# Patient Record
Sex: Male | Born: 1965 | Race: Black or African American | Hispanic: No | Marital: Married | Smoking: Never smoker
Health system: Southern US, Community
[De-identification: ages and names within clinical notes are randomized; demographics above are authoritative.]

---

## 2006-07-17 ENCOUNTER — Emergency Department (HOSPITAL_COMMUNITY): Admission: EM | Admit: 2006-07-17 | Discharge: 2006-07-17 | Payer: Self-pay | Admitting: Emergency Medicine
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
## 2015-12-01 ENCOUNTER — Encounter (HOSPITAL_COMMUNITY): Payer: Self-pay | Admitting: Adult Health

## 2015-12-01 ENCOUNTER — Emergency Department (HOSPITAL_COMMUNITY)
Admission: EM | Admit: 2015-12-01 | Discharge: 2015-12-01 | Disposition: A | Discharge status: Home / Self Care | Payer: Self-pay | Attending: Emergency Medicine | Admitting: Emergency Medicine

## 2015-12-01 DIAGNOSIS — Z23 Encounter for immunization: Secondary | ICD-10-CM | POA: Insufficient documentation

## 2015-12-01 DIAGNOSIS — Y929 Unspecified place or not applicable: Secondary | ICD-10-CM | POA: Insufficient documentation

## 2015-12-01 DIAGNOSIS — Y9389 Activity, other specified: Secondary | ICD-10-CM | POA: Insufficient documentation

## 2015-12-01 DIAGNOSIS — S61011A Laceration without foreign body of right thumb without damage to nail, initial encounter: Secondary | ICD-10-CM | POA: Insufficient documentation

## 2015-12-01 DIAGNOSIS — W268XXA Contact with other sharp object(s), not elsewhere classified, initial encounter: Secondary | ICD-10-CM | POA: Insufficient documentation

## 2015-12-01 DIAGNOSIS — Y999 Unspecified external cause status: Secondary | ICD-10-CM | POA: Insufficient documentation

## 2015-12-01 MED ORDER — LIDOCAINE HCL (PF) 2 % IJ SOLN
10.0000 mL | Freq: Once | INTRAMUSCULAR | Status: DC
  Filled 2015-12-01: qty 10

## 2015-12-01 MED ORDER — TETANUS-DIPHTH-ACELL PERTUSSIS 5-2.5-18.5 LF-MCG/0.5 IM SUSP
0.5000 mL | Freq: Once | INTRAMUSCULAR | Status: AC
  Administered 2015-12-01: 0.5 mL via INTRAMUSCULAR
  Filled 2015-12-01: qty 0.5

## 2015-12-01 MED ORDER — SULFAMETHOXAZOLE-TRIMETHOPRIM 800-160 MG PO TABS: 1.0000 | ORAL_TABLET | Freq: Two times a day (BID) | ORAL | 0 refills | Status: AC

## 2015-12-01 NOTE — Discharge Instructions (Signed)
Wound recheck in 2 days.  Suture removal in 8 days

## 2015-12-01 NOTE — ED Triage Notes (Signed)
Presents with laceration to right thumb from a window that broke yesterday evening. CMS intact.

## 2015-12-01 NOTE — ED Provider Notes (Signed)
[facility]-EMERGENCY DEPT Provider Note   CSN: [id number] Arrival date & time: 12/01/15  1143  By signing my name below, I, Marcus Dudley, attest that this documentation has been prepared under the direction and in the presence of Langston MaskerKaren Jebediah Macrae, [place]PA-C. Electronically Signed: Angelene GiovanniEmmanuella Dudley, ED Scribe. 12/01/15. 1:30 PM.   History   Chief Complaint Chief Complaint  Patient presents with  . Laceration    HPI Comments: Marcus Dudley is a 50 y.o. male who presents to the Emergency Department complaining of an actively bleeding 3 cm laceration to the dorsum of right thumb and a smaller laceration to the right 5th finger s/p injury that occurred 10:30 pm last night. He explains that he was working on a broken window when it lacerated his hand. He denies any other injuries sustained. No alleviating factors noted. Pt has not tried any medications PTA. He has NKDA. He is unsure of his last tetanus vaccine. He denies any fever, chills, vomiting, or any other symptoms.   The history is provided by the patient. No language interpreter was used.    History reviewed. No pertinent past medical history.  There are no active problems to display for this patient.   History reviewed. No pertinent surgical history.     Home Medications    Prior to Admission medications   Not on File    Family History History reviewed. No pertinent family history.  Social History Social History  Substance Use Topics  . Smoking status: Never Smoker  . Smokeless tobacco: Never Used  . Alcohol use Yes     Comment: Drinks every day     Allergies   Patient has no known allergies.   Review of Systems Review of Systems  Constitutional: Negative for chills and fever.  Gastrointestinal: Negative for vomiting.  Skin: Positive for wound.  All other systems reviewed and are negative.    Physical Exam Updated Vital Signs BP 180/100 (BP Location: Right Arm)   Pulse 113   Temp 99 F (37.2 C) (Oral)    Resp 18   SpO2 100%   Physical Exam  Constitutional: He is oriented to person, place, and time. He appears well-developed and well-nourished. No distress.  HENT:  Head: Normocephalic and atraumatic.  Eyes: Conjunctivae and EOM are normal.  Neck: Neck supple. No tracheal deviation present.  Cardiovascular: Normal rate.   Pulmonary/Chest: Effort normal. No respiratory distress.  Musculoskeletal: Normal range of motion.  3 cm laceration to dorsum of left thumb, gapping Full ROM; neurovascularly and neurosensory intact   Neurological: He is alert and oriented to person, place, and time.  Skin: Skin is warm and dry.  Psychiatric: He has a normal mood and affect. His behavior is normal.  Nursing note and vitals reviewed.    ED Treatments / Results  DIAGNOSTIC STUDIES: Oxygen Saturation is 100% on RA, normal by my interpretation.    COORDINATION OF CARE: 12:46 PM- Pt advised of plan for treatment and pt agrees. Pt will receive Tdap here and laceration repair.    Labs (all labs ordered are listed, but only abnormal results are displayed) Labs Reviewed - No data to display  EKG  EKG Interpretation None       Radiology No results found.  Procedures .Marland Kitchen.Laceration Repair Date/Time: 12/01/2015 1:27 PM Performed by: Elson AreasSOFIA, Lety Cullens K Authorized by: Elson AreasSOFIA, Suhaib Guzzo K   Consent:    Consent obtained:  Verbal   Consent given by:  Patient   Risks discussed:  Pain Anesthesia (see MAR for  exact dosages):    Anesthesia method:  Local infiltration   Local anesthetic:  Lidocaine 2% w/o epi (3 cc) Laceration details:    Location:  Finger   Finger location:  R thumb   Length (cm):  3 Repair type:    Repair type:  Simple Pre-procedure details:    Preparation:  Patient was prepped and draped in usual sterile fashion Exploration:    Wound exploration: wound explored through full range of motion     Contaminated: no   Treatment:    Area cleansed with:  Betadine   Amount of  cleaning:  Standard   Irrigation solution:  Sterile saline   Irrigation method:  Pressure wash Skin repair:    Repair method:  Sutures   Suture size:  5-0   Suture material:  Prolene   Suture technique:  Simple interrupted   Number of sutures:  3 Approximation:    Approximation:  Close   Vermilion border: well-aligned   Post-procedure details:    Dressing:  Antibiotic ointment   Patient tolerance of procedure:  Tolerated well, no immediate complications    (including critical care time)  Medications Ordered in ED Medications  Tdap (BOOSTRIX) injection 0.5 mL (0.5 mLs Intramuscular Given 12/01/15 1206)     Initial Impression / Assessment and Plan / ED Course  Langston MaskerKaren Dafina Suk, PA-C has reviewed the triage vital signs and the nursing notes.  Pertinent labs & imaging results that were available during my care of the patient were reviewed by me and considered in my medical decision making (see chart for details).  Clinical Course      Tdap booster given. Pressure irrigation performed. Laceration occurred > 8 hours prior to repair which was well tolerated. Pt has no co morbidities to effect normal wound healing. Discussed suture home care w pt and answered questions. Pt to f-u for wound check and suture removal in 7 days. Pt is hemodynamically stable w no complaints prior to dc.     Final Clinical Impressions(s) / ED Diagnoses   Final diagnoses:  None    New Prescriptions Discharge Medication List as of 12/01/2015  1:31 PM    START taking these medications   Details  sulfamethoxazole-trimethoprim (BACTRIM DS,SEPTRA DS) 800-160 MG tablet Take 1 tablet by mouth 2 (two) times daily., Starting Mon 12/01/2015, Until Mon 12/08/2015, Print       An After Visit Summary was printed and given to the patient.  I personally performed the services in this documentation, which was scribed in my presence.  The recorded information has been reviewed and considered.   Barnet PallKaren SofiaPAC.     Lonia SkinnerLeslie K MistonSofia, PA-C 12/02/15 [id number]    Geoffery Lyonsouglas Delo, MD 12/02/15 [phone]

## 2019-10-09 ENCOUNTER — Other Ambulatory Visit: Payer: Self-pay

## 2019-10-09 ENCOUNTER — Encounter (HOSPITAL_COMMUNITY): Payer: Self-pay | Admitting: *Deleted

## 2019-10-09 ENCOUNTER — Emergency Department (HOSPITAL_COMMUNITY)
Admission: EM | Admit: 2019-10-09 | Discharge: 2019-10-09 | Disposition: A | Discharge status: Home / Self Care | Payer: Self-pay | Attending: Emergency Medicine | Admitting: Emergency Medicine

## 2019-10-09 ENCOUNTER — Emergency Department (HOSPITAL_COMMUNITY): Payer: Self-pay

## 2019-10-09 DIAGNOSIS — I10 Essential (primary) hypertension: Secondary | ICD-10-CM | POA: Insufficient documentation

## 2019-10-09 LAB — CBC WITH DIFFERENTIAL/PLATELET
Abs Immature Granulocytes: 0.01 10*3/uL (ref 0.00–0.07)
Basophils Absolute: 0 10*3/uL (ref 0.0–0.1)
Basophils Relative: 1 %
Eosinophils Absolute: 0 10*3/uL (ref 0.0–0.5)
Eosinophils Relative: 0 %
HCT: 41.4 % (ref 39.0–52.0)
Hemoglobin: 13.5 g/dL (ref 13.0–17.0)
Immature Granulocytes: 0 %
Lymphocytes Relative: 19 %
Lymphs Abs: 1 10*3/uL (ref 0.7–4.0)
MCH: 27.8 pg (ref 26.0–34.0)
MCHC: 32.6 g/dL (ref 30.0–36.0)
MCV: 85.2 fL (ref 80.0–100.0)
Monocytes Absolute: 0.7 10*3/uL (ref 0.1–1.0)
Monocytes Relative: 14 %
Neutro Abs: 3.2 10*3/uL (ref 1.7–7.7)
Neutrophils Relative %: 66 %
Platelets: 369 10*3/uL (ref 150–400)
RBC: 4.86 MIL/uL (ref 4.22–5.81)
RDW: 15.1 % (ref 11.5–15.5)
WBC: 4.9 10*3/uL (ref 4.0–10.5)
nRBC: 0 % (ref 0.0–0.2)

## 2019-10-09 LAB — HEPATIC FUNCTION PANEL
ALT: 25 U/L (ref 0–44)
AST: 34 U/L (ref 15–41)
Albumin: 4.1 g/dL (ref 3.5–5.0)
Alkaline Phosphatase: 74 U/L (ref 38–126)
Bilirubin, Direct: 0.2 mg/dL (ref 0.0–0.2)
Indirect Bilirubin: 1.3 mg/dL — ABNORMAL HIGH (ref 0.3–0.9)
Total Bilirubin: 1.5 mg/dL — ABNORMAL HIGH (ref 0.3–1.2)
Total Protein: 9.3 g/dL — ABNORMAL HIGH (ref 6.5–8.1)

## 2019-10-09 LAB — BASIC METABOLIC PANEL
Anion gap: 13 (ref 5–15)
BUN: 15 mg/dL (ref 6–20)
CO2: 25 mmol/L (ref 22–32)
Calcium: 9.5 mg/dL (ref 8.9–10.3)
Chloride: 97 mmol/L — ABNORMAL LOW (ref 98–111)
Creatinine, Ser: 0.79 mg/dL (ref 0.61–1.24)
GFR calc Af Amer: >60 mL/min (ref >60)
GFR calc non Af Amer: >60 mL/min (ref >60)
Glucose, Bld: 103 mg/dL — ABNORMAL HIGH (ref 70–99)
Potassium: 3.2 mmol/L — ABNORMAL LOW (ref 3.5–5.1)
Sodium: 135 mmol/L (ref 135–145)

## 2019-10-09 MED ORDER — LABETALOL HCL 100 MG PO TABS: 100.0000 mg | ORAL_TABLET | Freq: Two times a day (BID) | ORAL | 0 refills | Status: AC

## 2019-10-09 MED ORDER — LABETALOL HCL 5 MG/ML IV SOLN
20.0000 mg | Freq: Once | INTRAVENOUS | Status: AC
  Administered 2019-10-09: 20 mg via INTRAVENOUS
  Filled 2019-10-09: qty 4

## 2019-10-09 NOTE — ED Provider Notes (Signed)
[facility] EMERGENCY DEPARTMENT Provider Note   CSN: [id number] Arrival date & time: 10/09/19  1152     History Chief Complaint  Patient presents with  . Hypertension    Marcus Dudley is a 54 y.o. male.  Patient was applying for a job and had his blood pressure checked and it was elevated.  He has not seen a doctor in a long time.  Patient has no symptoms now  The history is provided by the patient and medical records. No language interpreter was used.  Hypertension This is a new problem. Episode onset: Unknown. The problem occurs constantly. The problem has not changed since onset.Pertinent negatives include no chest pain, no abdominal pain and no headaches. Nothing aggravates the symptoms. Nothing relieves the symptoms. He has tried nothing for the symptoms. The treatment provided no relief.       History reviewed. No pertinent past medical history.  There are no problems to display for this patient.   History reviewed. No pertinent surgical history.     History reviewed. No pertinent family history.  Social History   Tobacco Use  . Smoking status: Never Smoker  . Smokeless tobacco: Never Used  Substance Use Topics  . Alcohol use: Yes    Comment: Drinks every day  . Drug use: No    Home Medications Prior to Admission medications   Medication Sig Start Date End Date Taking? Authorizing Provider  labetalol (NORMODYNE) 100 MG tablet Take 1 tablet (100 mg total) by mouth 2 (two) times daily. 10/09/19   Bethann Berkshire, MD    Allergies    Patient has no known allergies.  Review of Systems   Review of Systems  Constitutional: Negative for appetite change and fatigue.  HENT: Negative for congestion, ear discharge and sinus pressure.   Eyes: Negative for discharge.  Respiratory: Negative for cough.   Cardiovascular: Negative for chest pain.  Gastrointestinal: Negative for abdominal pain and diarrhea.  Genitourinary: Negative for frequency and hematuria.    Musculoskeletal: Negative for back pain.  Skin: Negative for rash.  Neurological: Negative for seizures and headaches.  Psychiatric/Behavioral: Negative for hallucinations.    Physical Exam Updated Vital Signs BP (!) 224/102 (BP Location: Left Arm)   Pulse (!) 103   Temp 98.2 F (36.8 C) (Oral)   Resp 20   Ht 5\' 11"  (1.803 m)   Wt 74.8 kg   SpO2 99%   BMI 23.01 kg/m   Physical Exam Vitals and nursing note reviewed.  Constitutional:      Appearance: He is well-developed.  HENT:     Head: Normocephalic.     Right Ear: Tympanic membrane normal.  Eyes:     General: No scleral icterus.    Conjunctiva/sclera: Conjunctivae normal.  Neck:     Thyroid: No thyromegaly.  Cardiovascular:     Rate and Rhythm: Normal rate and regular rhythm.     Heart sounds: No murmur heard.  No friction rub. No gallop.   Pulmonary:     Breath sounds: No stridor. No wheezing or rales.  Chest:     Chest wall: No tenderness.  Abdominal:     General: There is no distension.     Tenderness: There is no abdominal tenderness. There is no rebound.  Musculoskeletal:        General: Normal range of motion.     Cervical back: Neck supple.  Lymphadenopathy:     Cervical: No cervical adenopathy.  Skin:    Findings: No erythema  or rash.  Neurological:     Mental Status: He is alert and oriented to person, place, and time.     Motor: No abnormal muscle tone.     Coordination: Coordination normal.  Psychiatric:        Behavior: Behavior normal.     ED Results / Procedures / Treatments   Labs (all labs ordered are listed, but only abnormal results are displayed) Labs Reviewed  BASIC METABOLIC PANEL - Abnormal; Notable for the following components:      Result Value   Potassium 3.2 (*)    Chloride 97 (*)    Glucose, Bld 103 (*)    All other components within normal limits  HEPATIC FUNCTION PANEL - Abnormal; Notable for the following components:   Total Protein 9.3 (*)    Total Bilirubin 1.5  (*)    Indirect Bilirubin 1.3 (*)    All other components within normal limits  CBC WITH DIFFERENTIAL/PLATELET    EKG None  Radiology DG Chest 2 View  Result Date: 10/09/2019 CLINICAL DATA:  Hypertension EXAM: CHEST - 2 VIEW COMPARISON:  None. FINDINGS: The heart size and mediastinal contours are within normal limits. No focal airspace consolidation, pleural effusion, or pneumothorax. 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 IMPRESSION: No active cardiopulmonary disease. Electronically Signed   By: Duanne Guess D.O.   On: 10/09/2019 14:51    Procedures Procedures (including critical care time)  Medications Ordered in ED Medications  labetalol (NORMODYNE) injection 20 mg (20 mg Intravenous Given 10/09/19 1408)    ED Course  I have reviewed the triage vital signs and the nursing notes.  Pertinent labs & imaging results that were available during my care of the patient were reviewed by me and considered in my medical decision making (see chart for details).    MDM Rules/Calculators/A&P                          Patient with significant hypertension.  He is started on labetalol follow-up with the family doctor     This patient presents to the ED for concern of hypertension, this involves an extensive number of treatment options, and is a complaint that carries with it a high risk of complications and morbidity.  The differential diagnosis includes essential hypertension   Lab Tests:   I Ordered, reviewed, and interpreted labs, which included CBC and chemistries which show potassium mildly low at 3.2  Medicines ordered:   I ordered medication labetalol for blood pressure  Imaging Studies ordered:   I ordered imaging studies which included chest x-ray  I independently visualized and interpreted imaging which showed unremarkable  Additional history  obtained:   Additional history obtained from record  Previous records obtained and reviewed.  Consultations Obtained:     Reevaluation:  After the interventions stated above, I reevaluated the patient and found unchanged  Critical Interventions:  .   Final Clinical Impression(s) / ED Diagnoses Final diagnoses:  Essential hypertension    Rx / DC Orders ED Discharge Orders         Ordered    labetalol (NORMODYNE) 100 MG tablet  2 times daily        10/09/19 1532           Bethann Berkshire, MD 10/11/19 1054

## 2019-10-09 NOTE — ED Triage Notes (Signed)
Pt states he is starting a new job and while they were doing his physical his BP was elevated and he needed to be evaluated

## 2019-10-09 NOTE — Discharge Instructions (Addendum)
Follow up next week for recheck °

## 2022-02-25 IMAGING — DX DG CHEST 2V
2 series · 2 of 2 positions shown · non-contrast
Comparison: None.

CLINICAL DATA: Hypertension

EXAM:
CHEST - 2 VIEW

[chest pa]
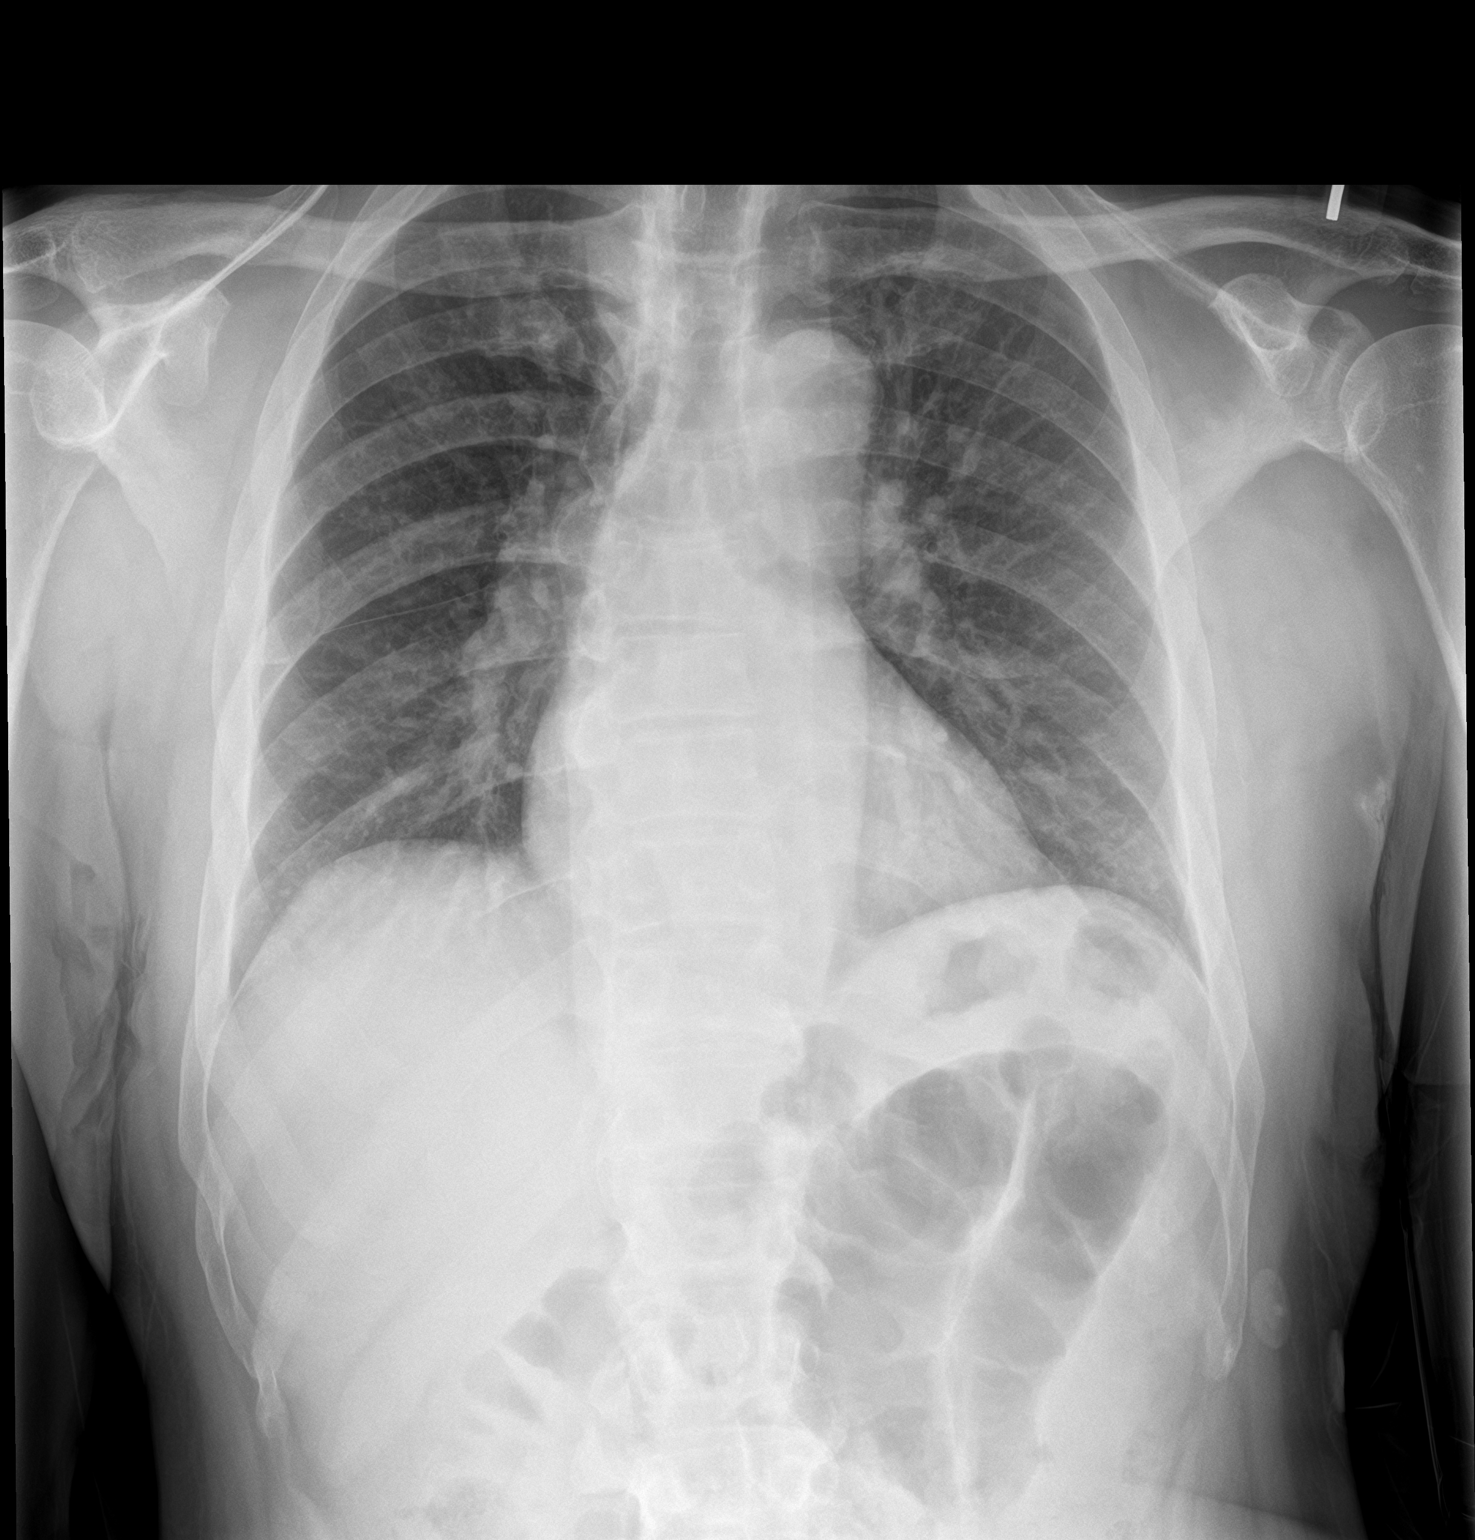

[chest lat]
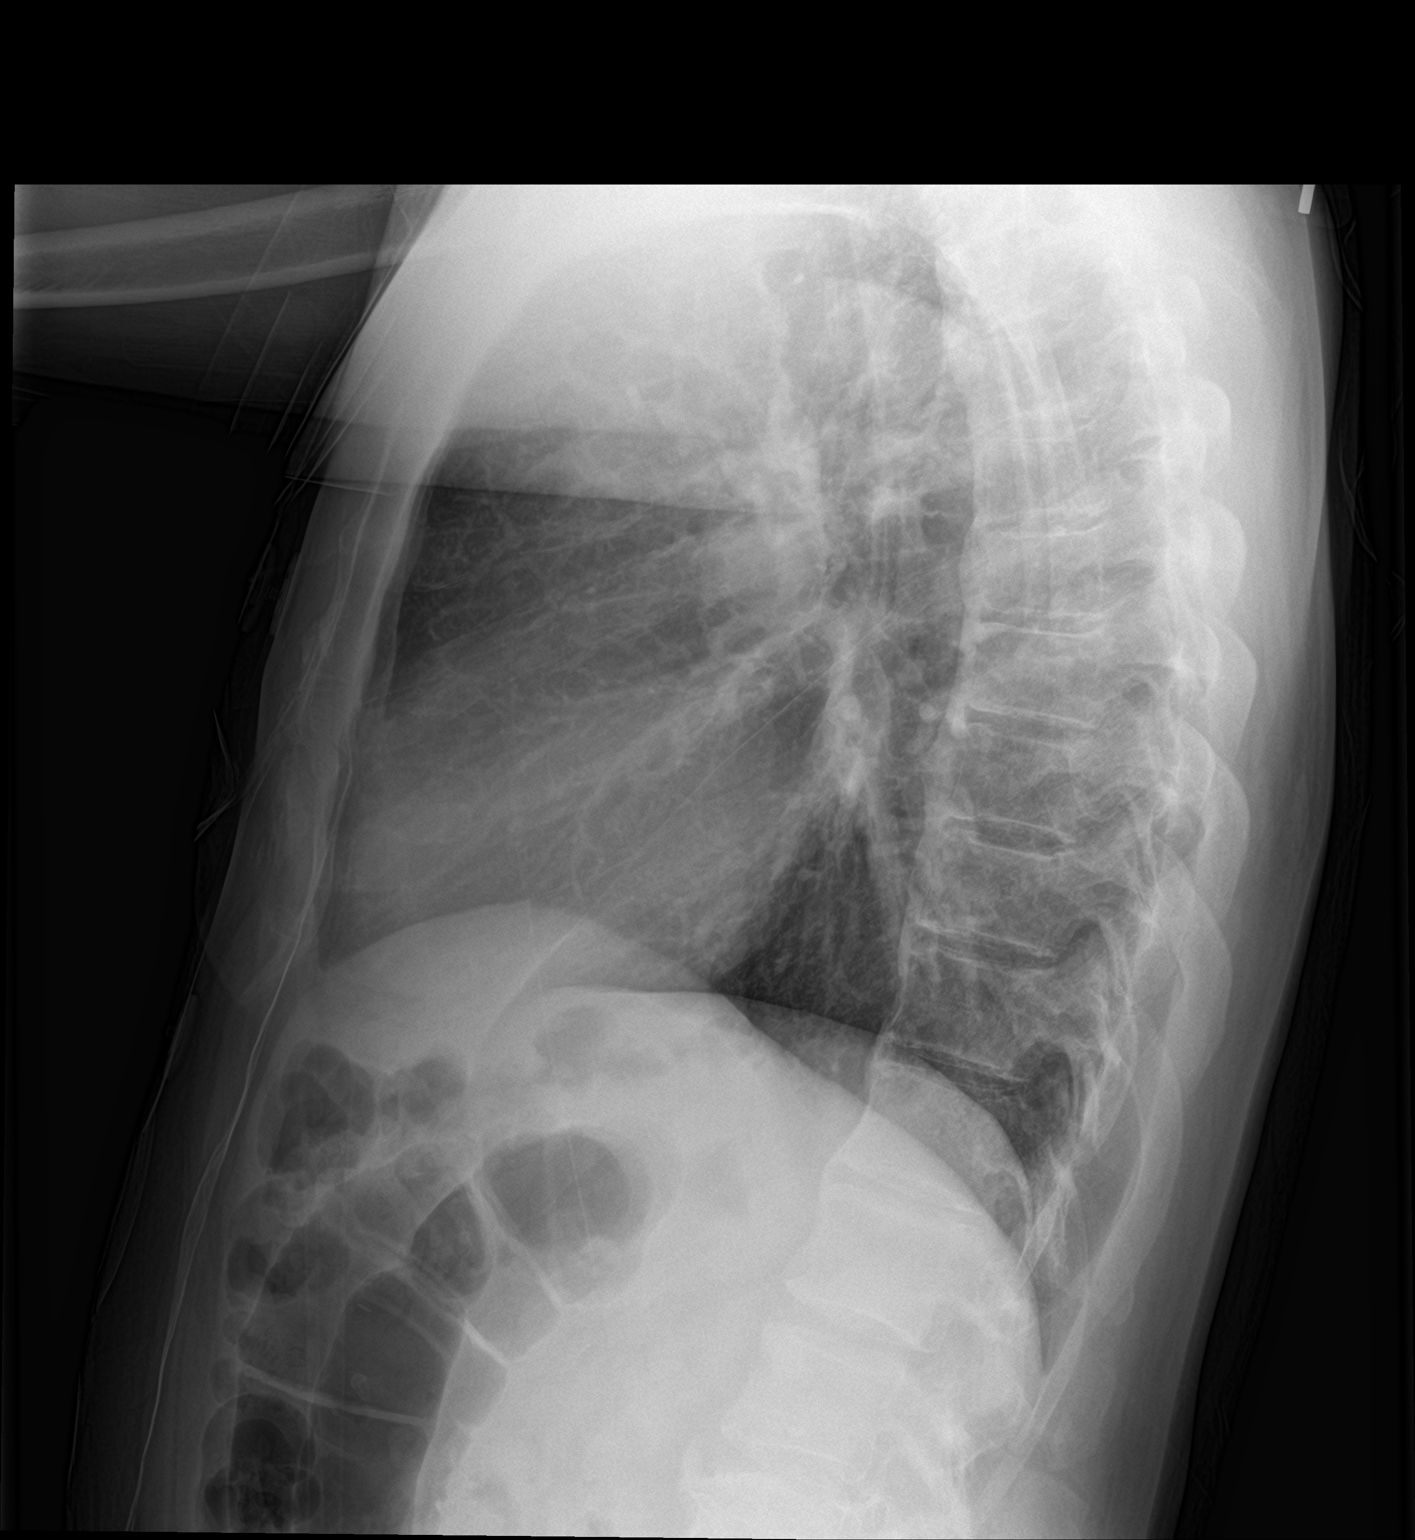

[2 of 2 positions shown; findings below may reference images not displayed]

FINDINGS: The heart size and mediastinal contours are within normal limits. No
focal airspace consolidation, pleural effusion, or pneumothorax.
Ossification of the anterior longitudinal ligament within the
thoracic spine, suggesting diffuse idiopathic skeletal hyperostosis
(DISH).
IMPRESSION: No active cardiopulmonary disease.
